# Patient Record
Sex: Male | Born: 1960 | Race: White | Hispanic: No | Marital: Married | Smoking: Never smoker
Health system: Southern US, Community
[De-identification: ages and names within clinical notes are randomized; demographics above are authoritative.]

## PROBLEM LIST (undated history)

## (undated) DIAGNOSIS — I499 Cardiac arrhythmia, unspecified: Secondary | ICD-10-CM

## (undated) DIAGNOSIS — I4891 Unspecified atrial fibrillation: Secondary | ICD-10-CM

## (undated) HISTORY — PX: COLONOSCOPY: SHX174

## (undated) HISTORY — PX: SHOULDER ARTHROSCOPY: SHX128

---

## 2005-04-15 ENCOUNTER — Ambulatory Visit: Payer: Self-pay | Admitting: Internal Medicine

## 2005-04-16 ENCOUNTER — Ambulatory Visit: Payer: Self-pay | Admitting: Internal Medicine

## 2007-01-13 ENCOUNTER — Ambulatory Visit: Payer: Self-pay | Admitting: Urology

## 2007-01-15 ENCOUNTER — Ambulatory Visit: Payer: Self-pay | Admitting: Urology

## 2007-02-11 IMAGING — US ABDOMEN ULTRASOUND
1 series · 17 of 25 positions shown · non-contrast
Comparison: none

REASON FOR EXAM: Epigastric pain
COMMENTS:

[Series 1: abdomen ultrasound · 17 of 55 slices shown]
[im 1/55]
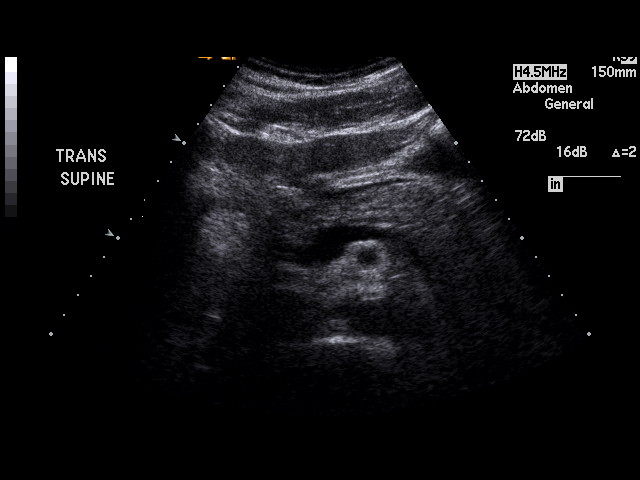
[im 5/55]
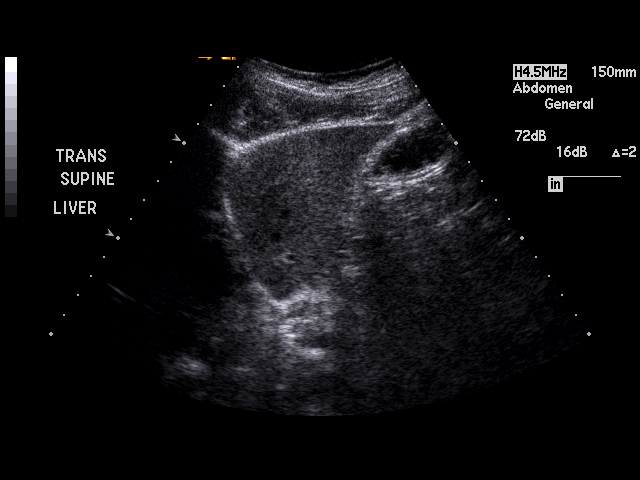
[im 7/55]
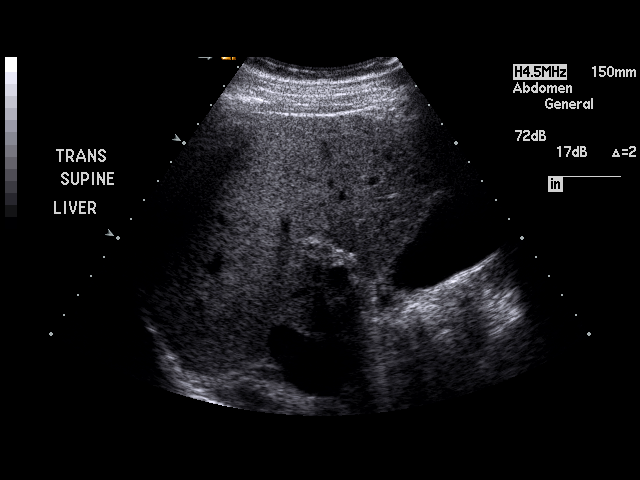
[im 12/55]
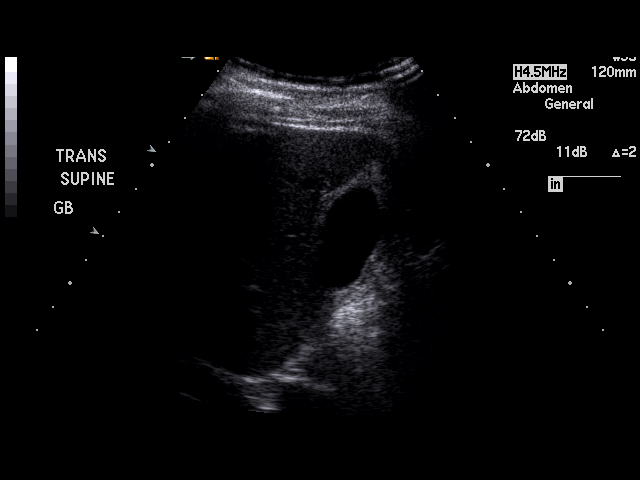
[im 14/55]
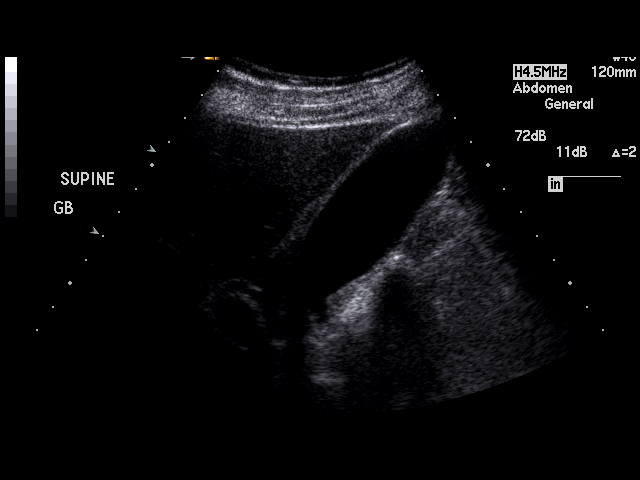
[im 19/55]
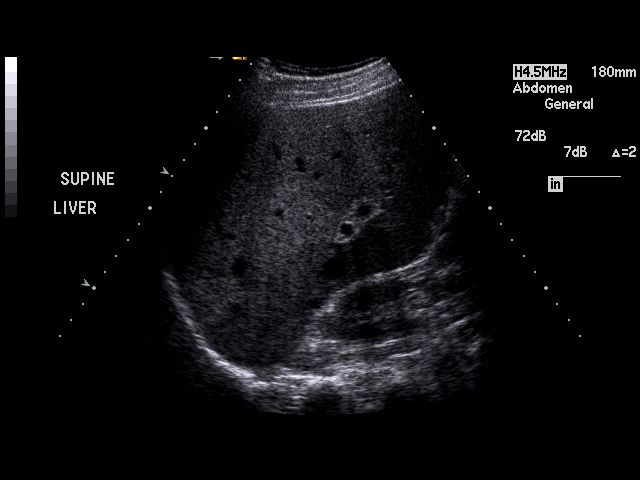
[im 21/55]
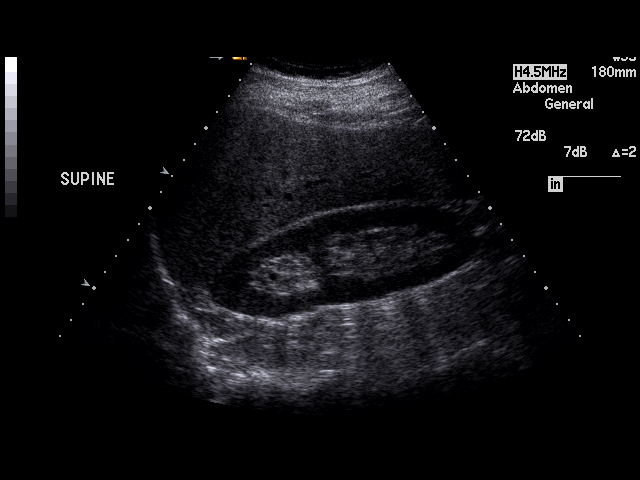
[im 25/55]
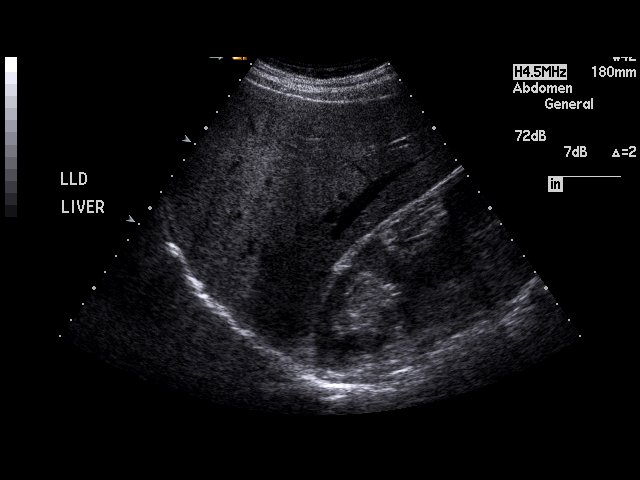
[im 28/55]
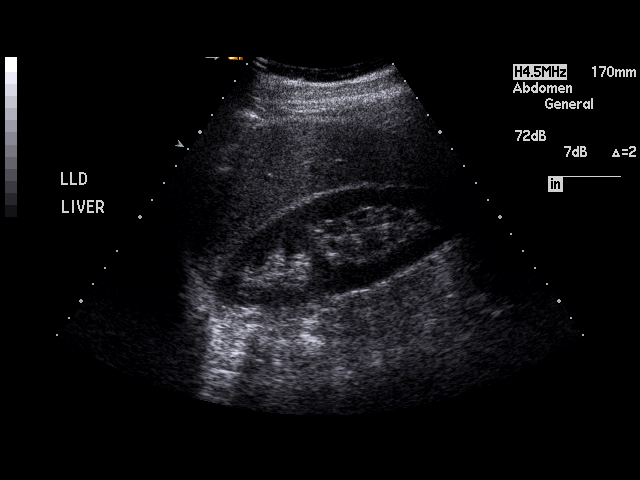
[im 30/55]
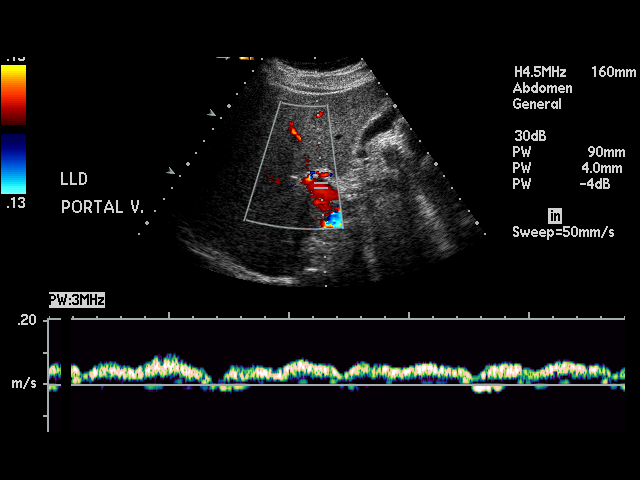
[im 34/55]
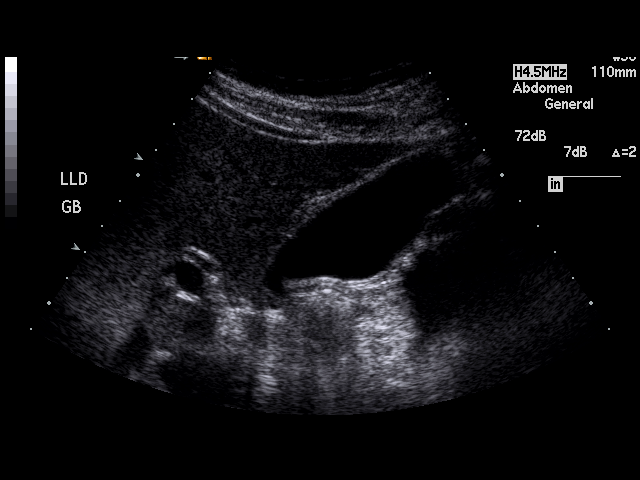
[im 37/55]
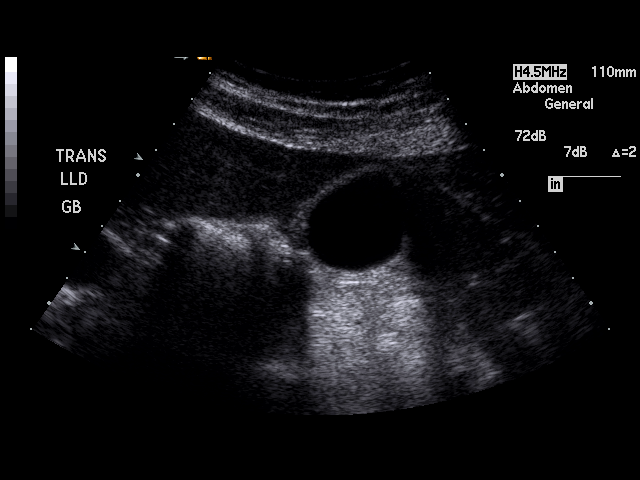
[im 41/55]
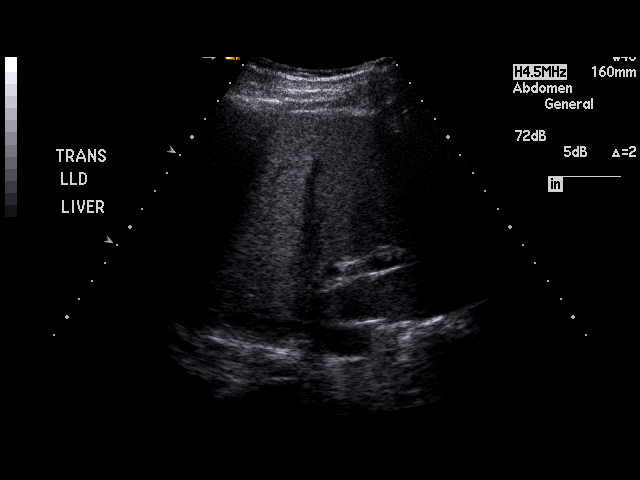
[im 43/55]
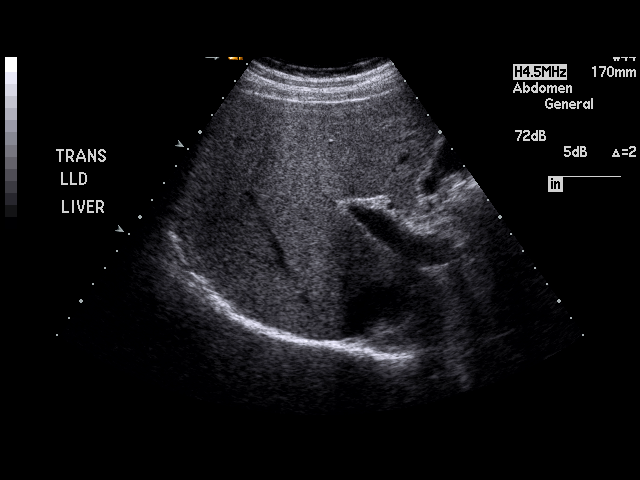
[im 48/55]
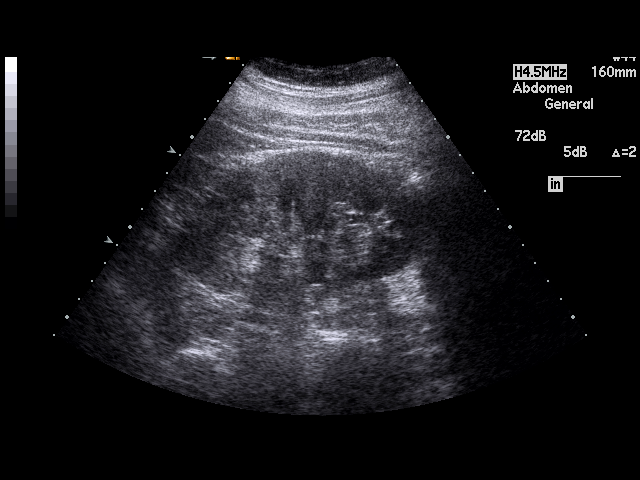
[im 50/55]
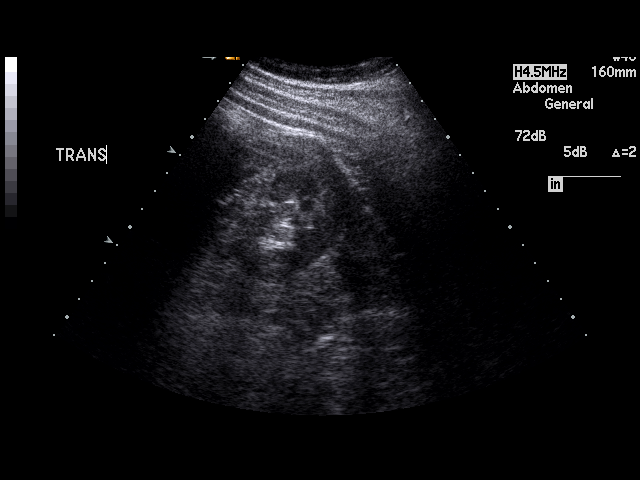
[im 55/55]
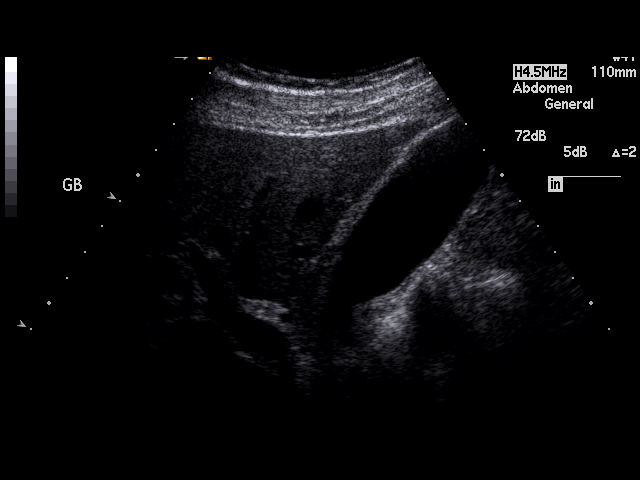

[17 of 25 positions shown; findings below may reference images not displayed]

PROCEDURE:     US  - US ABDOMEN GENERAL SURVEY  - April 16, 2005 [DATE]

RESULT:     The liver, spleen and pancreas are normal in appearance. No
gallstones are seen. There is no thickening of the gallbladder wall. The
common bile duct measures 4.8 mm in diameter which is within normal limits.
The kidneys show no hydronephrosis. There is no ascites.
IMPRESSION: No significant abnormalities are noted.

## 2010-01-08 ENCOUNTER — Ambulatory Visit: Payer: Self-pay | Admitting: Unknown Physician Specialty

## 2017-01-06 ENCOUNTER — Ambulatory Visit: Payer: Self-pay | Admitting: Medical

## 2017-01-06 ENCOUNTER — Encounter: Payer: Self-pay | Admitting: Medical

## 2017-01-06 VITALS — BP 118/78 | HR 78 | Temp 97.7°F | Resp 16 | Ht 75.0 in | Wt 225.0 lb

## 2017-01-06 DIAGNOSIS — D1722 Benign lipomatous neoplasm of skin and subcutaneous tissue of left arm: Secondary | ICD-10-CM

## 2017-01-06 DIAGNOSIS — M25512 Pain in left shoulder: Secondary | ICD-10-CM

## 2017-01-06 NOTE — Progress Notes (Addendum)
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 No acute or focal abnormality . No evidence of fracture or dislocation.   Subjective:    Patient ID: Keith James, male    DOB: 1961-02-13, 56 y.o.   MRN: [id number]  HPI  56 yo male started last Thursday with pain using left shoulder.   Pain on lifting arm(abduction), reaching at times (flexion) and arm reaching back (extension). He presents with a lipoma like growth  X  20 years, which has  changed in size over the years  .  Last seen by Dr. Kary Kos 8 yrs ago for referral to get a colonoscopy which was normal  And "they" told him next colonscopy was needed in 8 yrs.  He did start a new  exercise program but did not recall a specific movement that caused him pain.  He also has been removing river rock Doctor, hospital) from his yard, but again he recalls no injury.Taking ibuprofen with some relief , pain seems worse at night. Rates pain  6/10 with 8/10 at times.    Review of Systems  Constitutional: Negative for chills and fever.  HENT: Negative.   Eyes: Negative.   Respiratory: Negative.   Cardiovascular: Negative.   Gastrointestinal: Negative for diarrhea, nausea and vomiting.  Endocrine: Negative for cold intolerance and heat intolerance.  Genitourinary: Negative for dysuria.  Musculoskeletal: Negative for back pain, neck pain and neck stiffness.  Allergic/Immunologic: Negative for environmental allergies and food allergies.  Neurological: Negative for dizziness, syncope and light-headedness.  Hematological: Negative for adenopathy. Does not bruise/bleed easily.  Psychiatric/Behavioral: Negative for confusion and hallucinations.  No pain radiating down arm , no numbness or tingling.     Objective:   Physical Exam  Constitutional: He is oriented to person, place, and time. He appears well-developed and well-nourished.  HENT:  Head: Normocephalic and atraumatic.  Eyes: EOM are  normal. Pupils are equal, round, and reactive to light.  Musculoskeletal: He exhibits no edema or deformity.  Neurological: He is alert and oriented to person, place, and time.  Skin: Skin is warm and dry.  Psychiatric: He has a normal mood and affect. His behavior is normal.  Nursing note and vitals reviewed.   FROM of left shoulder however pain on abduction and external rotation and on extension.  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 Non- tender to palpation. Good range of motion of wrist and hand 2+ radial pulse.      Assessment & Plan:  Left shoulder pain, xray ordered , he will go on Wednesday morning 01/08/17. To take otc ibuprofen as needed for pain take as directed. Avoid exercising shoulder for now or lifting rocks. Will call patient with xray results once received. Xray reviewed with patient on 01/08/17, will refer to orthopedics, he requests to be seen by [facility].

## 2017-01-08 ENCOUNTER — Ambulatory Visit
Admission: RE | Admit: 2017-01-08 | Discharge: 2017-01-08 | Disposition: A | Discharge status: Home / Self Care | Payer: BLUE CROSS/BLUE SHIELD | Source: Ambulatory Visit | Attending: Medical | Admitting: Medical

## 2017-01-08 DIAGNOSIS — M25512 Pain in left shoulder: Secondary | ICD-10-CM | POA: Insufficient documentation

## 2017-01-08 NOTE — Addendum Note (Signed)
Addended by: Travious Vanover, Nira Conn R on: 01/08/2017 04:10 PM   Modules accepted: Orders

## 2017-01-09 ENCOUNTER — Telehealth: Payer: Self-pay | Admitting: Medical

## 2017-01-09 NOTE — Telephone Encounter (Signed)
Called patient 01/08/2017 to review left shoulder xray. Severe degenerative changes, will refer to orthopedics he prefers [employer]. Recommend he do no exercises or yard work that may aggravate shoulder.

## 2017-02-11 ENCOUNTER — Ambulatory Visit: Payer: Self-pay | Admitting: Medical

## 2017-02-11 ENCOUNTER — Encounter: Payer: Self-pay | Admitting: Medical

## 2017-02-11 VITALS — BP 110/70 | HR 64 | Temp 97.1°F | Resp 16 | Ht 73.0 in | Wt 220.0 lb

## 2017-02-11 DIAGNOSIS — H6983 Other specified disorders of Eustachian tube, bilateral: Secondary | ICD-10-CM

## 2017-02-11 NOTE — Progress Notes (Signed)
   Subjective:    Patient ID: Keith James, male    DOB: 03/30/1961, 56 y.o.   MRN: [id number]  HPI 56 yo male with complaints of difficulty hearing on the right ear "like I was talking in a tin can",no pain. Started on Saturday evening and cleared up this morning.He states his hearing is back to normal today. No other complaints.   Review of Systems  Constitutional: Negative for chills and fever.  HENT: Negative for congestion, ear pain and sore throat.   Eyes: Negative for discharge and itching.  Respiratory: Negative for shortness of breath.   Cardiovascular: Negative for chest pain.  Gastrointestinal: Negative for diarrhea, nausea and vomiting.  Endocrine: Negative for polydipsia.  Genitourinary: Negative for hematuria.  Musculoskeletal: Negative for myalgias.  Allergic/Immunologic: Negative for environmental allergies and food allergies.  Neurological: Negative for dizziness and syncope.  Hematological: Negative for adenopathy.  Psychiatric/Behavioral: Negative for confusion and hallucinations.       Objective:   Physical Exam  Constitutional: He is oriented to person, place, and time. He appears well-developed and well-nourished.  HENT:  Head: Normocephalic and atraumatic.  Right Ear: External ear normal. A middle ear effusion is present.  Left Ear: External ear normal. A middle ear effusion is present.  Nose: Nose normal.  Mouth/Throat: Oropharynx is clear and moist.  Eyes: Conjunctivae and EOM are normal. Pupils are equal, round, and reactive to light.  Neck: Normal range of motion. Neck supple.  Cardiovascular: Normal rate and regular rhythm.  Exam reveals no gallop and no friction rub.   No murmur heard. Pulmonary/Chest: Effort normal and breath sounds normal.  Lymphadenopathy:    He has no cervical adenopathy.  Neurological: He is alert and oriented to person, place, and time.  Skin: Skin is warm and dry.  Psychiatric: He has a normal mood and affect. His  behavior is normal.  Nursing note and vitals reviewed.  L>R fluid behind TM       Assessment & Plan:   Eustachian tube dysfunction, recommended OTC Zyrtec or Claritin. He says he does not like taking medication so he won't be using the OTC medications.  I also recommended that if it worsens to try OTC Zyrtec -D or Claritin-D to help dry up any fluid behind the TM. He wanted to know if we could just suck out the fluid, explained to patient it is located behind the ear drum. He says today it is not bothering him. He feels fine. Reviewed with patient if it becomes painful to return to the clinic. Return to the clinic as needed.

## 2018-11-05 IMAGING — CR DG SHOULDER 2+V*L*
1 series · 3 of 3 positions shown · non-contrast
Comparison: No recent prior .

CLINICAL DATA: Pain left shoulder.  No reported injury.

EXAM:
LEFT SHOULDER - 2+ VIEW

[Series 1: dg shoulder left · 0.14mm/px · 3 of 3 slices shown]
[im 1/3]
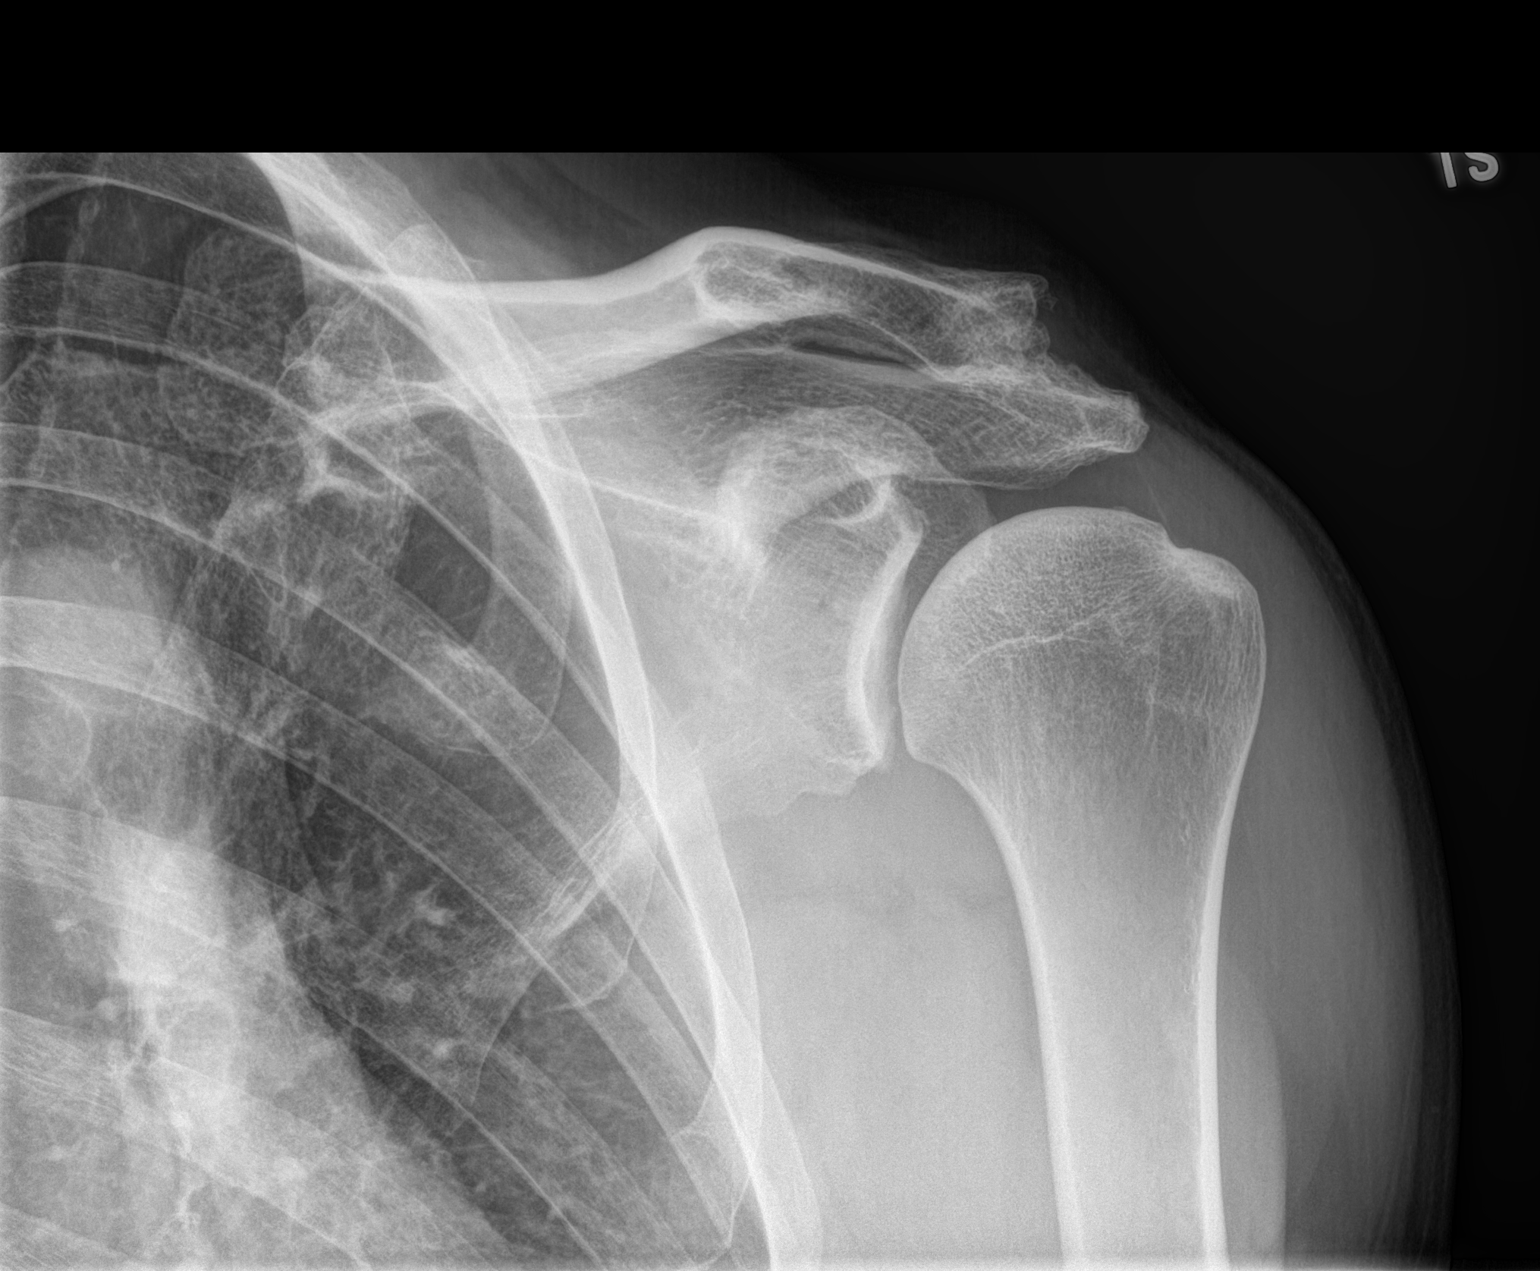
[im 2/3]
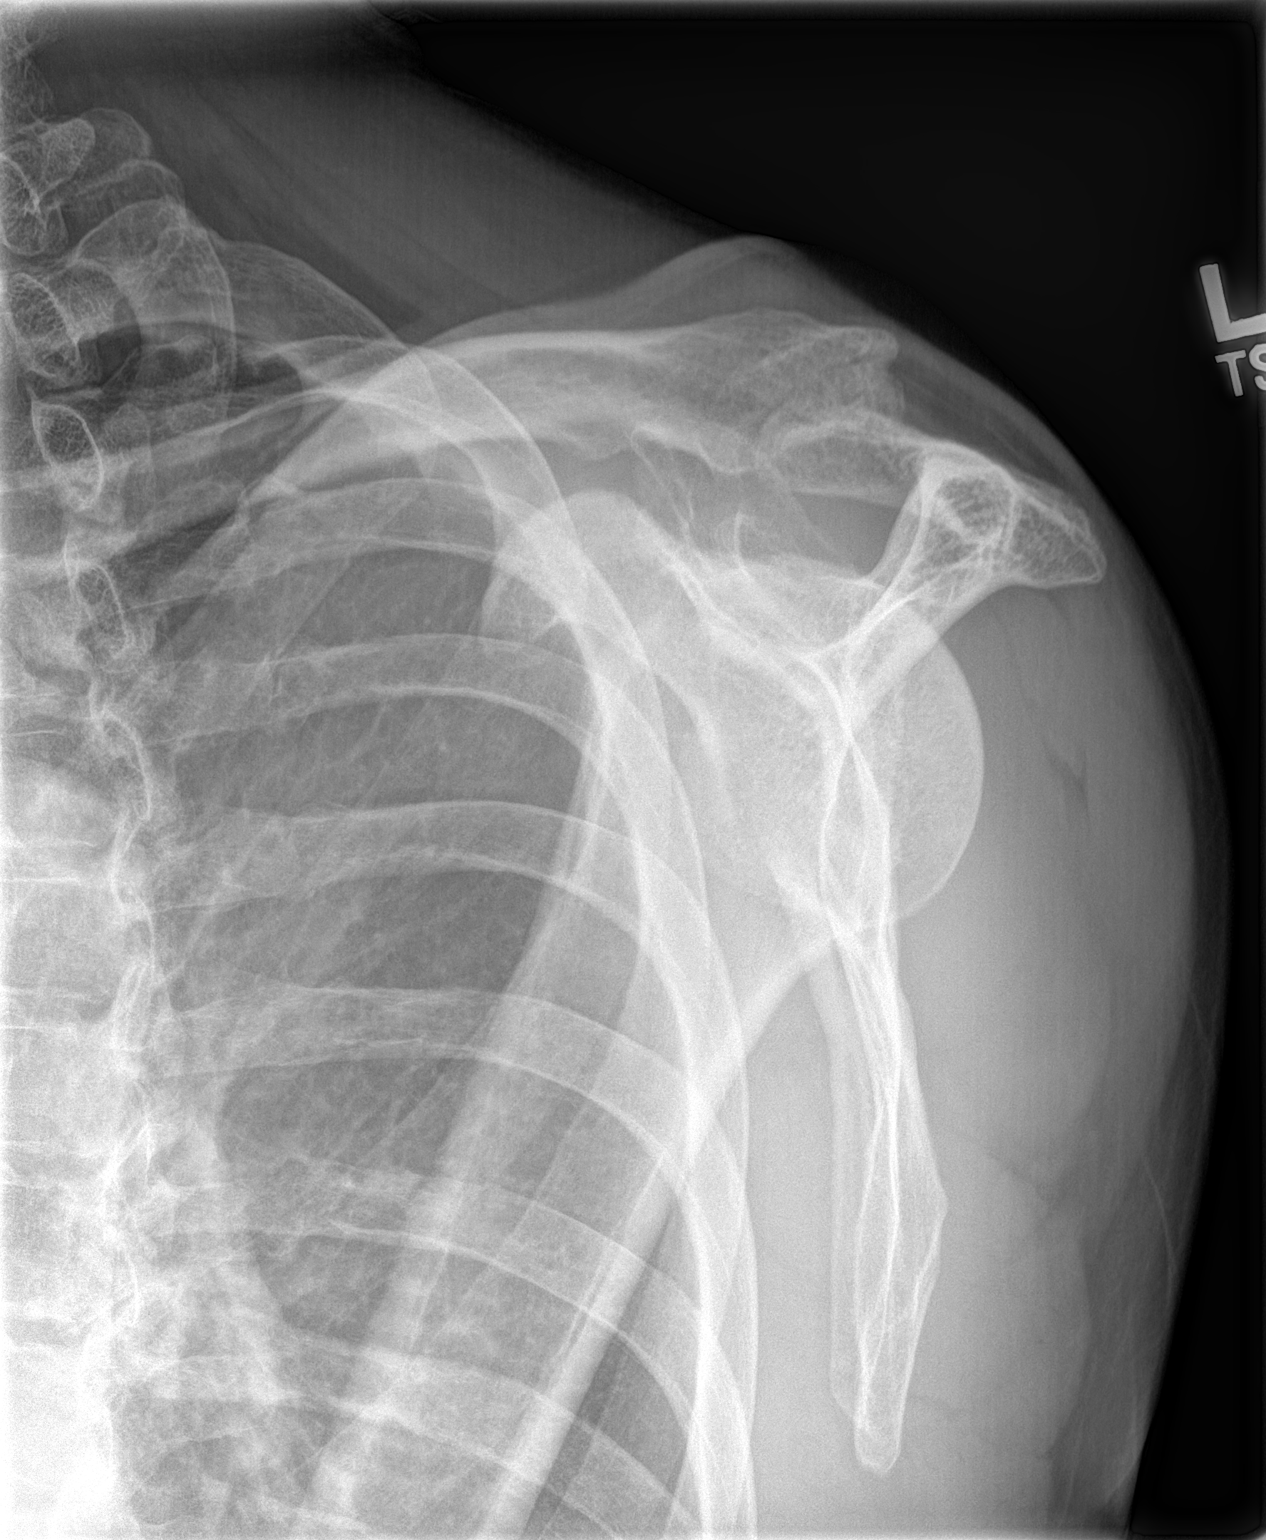
[im 3/3]
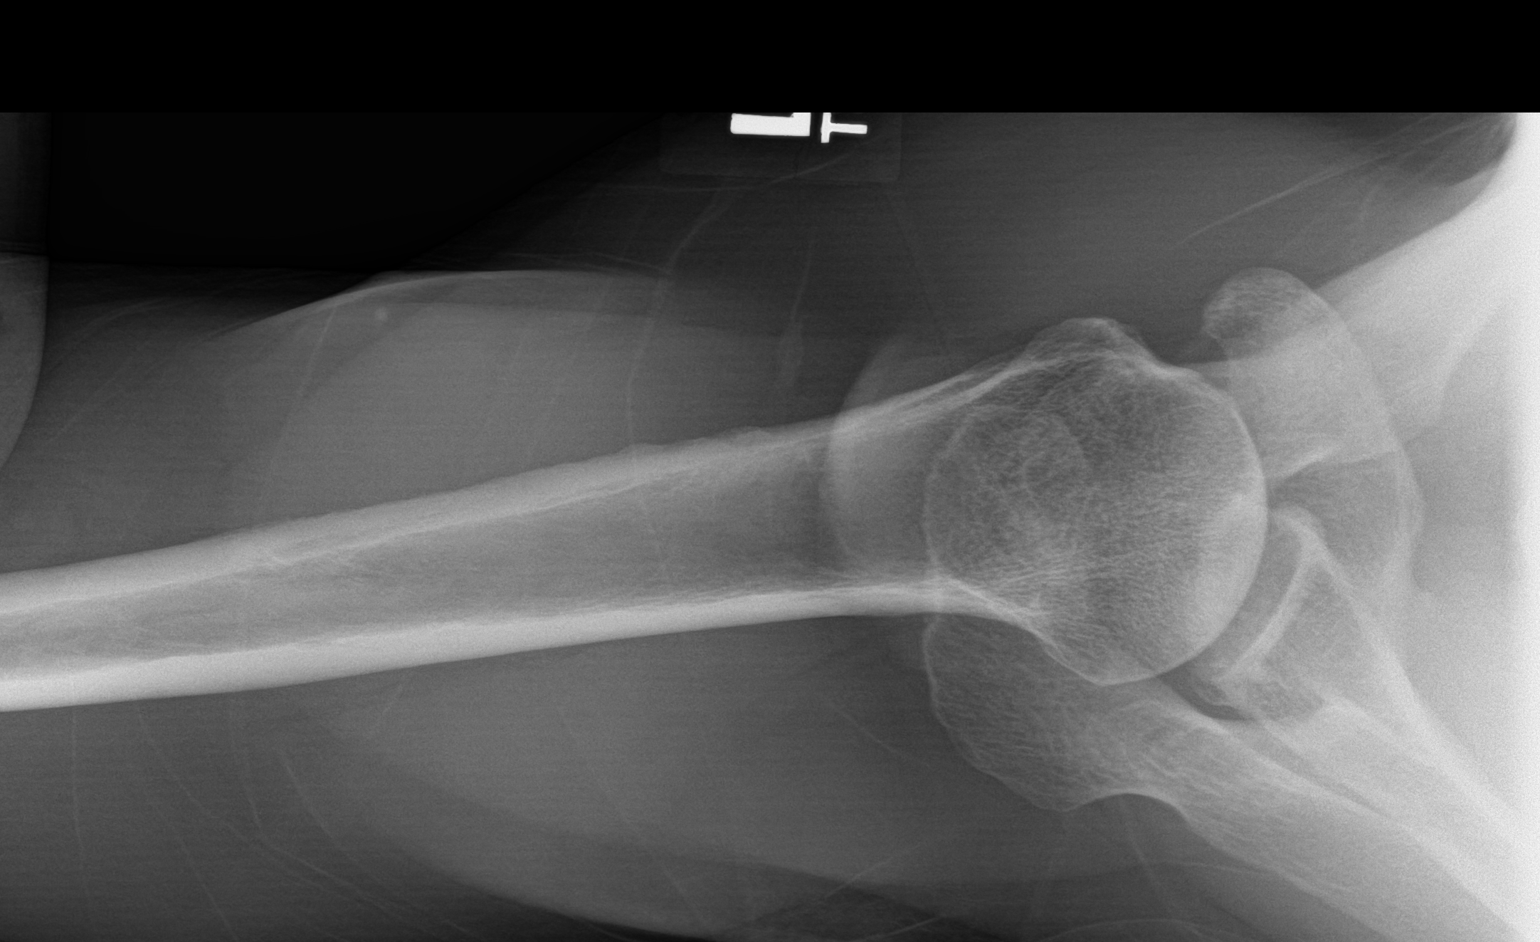

[3 of 3 positions shown; findings below may reference images not displayed]

FINDINGS: Severe acromioclavicular and glenohumeral degenerative change. No
acute or focal abnormality . No evidence of fracture or dislocation.
IMPRESSION: Severe acromioclavicular glenohumeral degenerative change. No acute
abnormality.

## 2019-07-09 ENCOUNTER — Other Ambulatory Visit
Admission: RE | Admit: 2019-07-09 | Discharge: 2019-07-09 | Disposition: A | Discharge status: Home / Self Care | Payer: BC Managed Care – PPO | Source: Ambulatory Visit | Attending: Cardiology | Admitting: Cardiology

## 2019-07-09 DIAGNOSIS — Z20828 Contact with and (suspected) exposure to other viral communicable diseases: Secondary | ICD-10-CM | POA: Diagnosis not present

## 2019-07-09 DIAGNOSIS — Z01812 Encounter for preprocedural laboratory examination: Secondary | ICD-10-CM | POA: Diagnosis present

## 2019-07-10 LAB — SARS CORONAVIRUS 2 (TAT 6-24 HRS): SARS Coronavirus 2: NEGATIVE

## 2019-07-13 ENCOUNTER — Ambulatory Visit: Payer: BC Managed Care – PPO | Admitting: Anesthesiology

## 2019-07-13 ENCOUNTER — Encounter
Admission: RE | Disposition: A | Discharge status: Home / Self Care | Payer: Self-pay | Source: Home / Self Care | Attending: Cardiology

## 2019-07-13 ENCOUNTER — Other Ambulatory Visit: Payer: Self-pay

## 2019-07-13 ENCOUNTER — Ambulatory Visit
Admission: RE | Admit: 2019-07-13 | Discharge: 2019-07-13 | Disposition: A | Discharge status: Home / Self Care | Payer: BC Managed Care – PPO | Attending: Cardiology | Admitting: Cardiology

## 2019-07-13 DIAGNOSIS — I4891 Unspecified atrial fibrillation: Secondary | ICD-10-CM | POA: Insufficient documentation

## 2019-07-13 HISTORY — PX: CARDIOVERSION: SHX1299

## 2019-07-13 SURGERY — CARDIOVERSION
Anesthesia: General

## 2019-07-13 MED ORDER — PHENYLEPHRINE HCL (PRESSORS) 10 MG/ML IV SOLN
INTRAVENOUS | Status: DC | PRN
  Administered 2019-07-13: 100 ug via INTRAVENOUS

## 2019-07-13 MED ORDER — SODIUM CHLORIDE 0.9 % IV SOLN
INTRAVENOUS | Status: DC
  Administered 2019-07-13: 07:00:00 via INTRAVENOUS

## 2019-07-13 MED ORDER — LIDOCAINE HCL (PF) 2 % IJ SOLN
INTRAMUSCULAR | Status: DC | PRN
  Administered 2019-07-13: 100 mg via INTRADERMAL

## 2019-07-13 MED ORDER — PROPOFOL 10 MG/ML IV BOLUS
INTRAVENOUS | Status: DC | PRN
  Administered 2019-07-13: 60 mg via INTRAVENOUS
  Administered 2019-07-13: 40 mg via INTRAVENOUS

## 2019-07-13 NOTE — Anesthesia Post-op Follow-up Note (Signed)
Anesthesia QCDR form completed.        

## 2019-07-13 NOTE — Anesthesia Preprocedure Evaluation (Addendum)
Anesthesia Evaluation  Patient identified by MRN, date of birth, ID band Patient awake    Reviewed: Allergy & Precautions, H&P , NPO status , Patient's Chart, lab work & pertinent test results  Airway Mallampati: II  TM Distance: >3 FB Neck ROM: full    Dental  (+) Teeth Intact   Pulmonary neg pulmonary ROS, neg COPD, neg recent URI,           Cardiovascular (-) angina(-) Past MI, (-) Cardiac Stents, (-) CABG and (-) CHF + dysrhythmias Atrial Fibrillation   NORMAL LEFT VENTRICULAR SYSTOLIC FUNCTION  WITH MILD LVH NORMAL RIGHT VENTRICULAR SYSTOLIC FUNCTION MILD VALVULAR REGURGITATION NO VALVULAR STENOSIS MILD MR, TR, PR EF 50-55%   Neuro/Psych neg Seizures negative neurological ROS  negative psych ROS   GI/Hepatic negative GI ROS, Neg liver ROS,   Endo/Other  negative endocrine ROS  Renal/GU negative Renal ROS  negative genitourinary   Musculoskeletal   Abdominal   Peds  Hematology negative hematology ROS (+)   Anesthesia Other Findings No past medical history on file.       Reproductive/Obstetrics negative OB ROS                            Anesthesia Physical Anesthesia Plan  ASA: II  Anesthesia Plan: General   Post-op Pain Management:    Induction:   PONV Risk Score and Plan:   Airway Management Planned: Natural Airway and Nasal Cannula  Additional Equipment:   Intra-op Plan:   Post-operative Plan:   Informed Consent: I have reviewed the patients History and Physical, chart, labs and discussed the procedure including the risks, benefits and alternatives for the proposed anesthesia with the patient or authorized representative who has indicated his/her understanding and acceptance.     Dental Advisory Given  Plan Discussed with: Anesthesiologist, CRNA and Surgeon  Anesthesia Plan Comments:         Anesthesia Quick Evaluation

## 2019-07-13 NOTE — Transfer of Care (Signed)
Immediate Anesthesia Transfer of Care Note  Patient: KINDLE CARVER  Procedure(s) Performed: CARDIOVERSION (N/A )  Patient Location: PACU and Cath Lab  Anesthesia Type:General  Level of Consciousness: awake, alert  and oriented  Airway & Oxygen Therapy: Patient Spontanous Breathing and Patient connected to nasal cannula oxygen  Post-op Assessment: Report given to RN and Post -op Vital signs reviewed and stable  Post vital signs: Reviewed and stable  Last Vitals:  Vitals Value Taken Time  BP 114/87 07/13/19 0746  Temp    Pulse 56 07/13/19 0747  Resp 13 07/13/19 0747  SpO2 98 % 07/13/19 0747    Last Pain:  Vitals:   07/13/19 0713  TempSrc: Oral  PainSc: 0-No pain         Complications: No apparent anesthesia complications

## 2019-07-13 NOTE — Discharge Instructions (Signed)
Electrical Cardioversion, Care After This sheet gives you information about how to care for yourself after your procedure. Your health care provider may also give you more specific instructions. If you have problems or questions, contact your health care provider. What can I expect after the procedure? After the procedure, it is common to have:  Some redness on the skin where the shocks were given. Follow these instructions at home:   Do not drive for 24 hours if you were given a medicine to help you relax (sedative).  Take over-the-counter and prescription medicines only as told by your health care provider.  Ask your health care provider how to check your pulse. Check it often.  Rest for 48 hours after the procedure or as told by your health care provider.  Avoid or limit your caffeine use as told by your health care provider. Contact a health care provider if:  You feel like your heart is beating too quickly or your pulse is not regular.  You have a serious muscle cramp that does not go away. Get help right away if:   You have discomfort in your chest.  You are dizzy or you feel faint.  You have trouble breathing or you are short of breath.  Your speech is slurred.  You have trouble moving an arm or leg on one side of your body.  Your fingers or toes turn cold or blue. This information is not intended to replace advice given to you by your health care provider. Make sure you discuss any questions you have with your health care provider. Document Released: 06/30/2013 Document Revised: 08/22/2017 Document Reviewed: 03/15/2016 Elsevier Patient Education  2020 Elsevier Inc.    Moderate Conscious Sedation, Adult, Care After These instructions provide you with information about caring for yourself after your procedure. Your health care provider may also give you more specific instructions. Your treatment has been planned according to current medical practices, but problems  sometimes occur. Call your health care provider if you have any problems or questions after your procedure. What can I expect after the procedure? After your procedure, it is common:  To feel sleepy for several hours.  To feel clumsy and have poor balance for several hours.  To have poor judgment for several hours.  To vomit if you eat too soon. Follow these instructions at home: For at least 24 hours after the procedure:   Do not: ? Participate in activities where you could fall or become injured. ? Drive. ? Use heavy machinery. ? Drink alcohol. ? Take sleeping pills or medicines that cause drowsiness. ? Make important decisions or sign legal documents. ? Take care of children on your own.  Rest. Eating and drinking  Follow the diet recommended by your health care provider.  If you vomit: ? Drink water, juice, or soup when you can drink without vomiting. ? Make sure you have little or no nausea before eating solid foods. General instructions  Have a responsible adult stay with you until you are awake and alert.  Take over-the-counter and prescription medicines only as told by your health care provider.  If you smoke, do not smoke without supervision.  Keep all follow-up visits as told by your health care provider. This is important. Contact a health care provider if:  You keep feeling nauseous or you keep vomiting.  You feel light-headed.  You develop a rash.  You have a fever. Get help right away if:  You have trouble breathing. This information is   not intended to replace advice given to you by your health care provider. Make sure you discuss any questions you have with your health care provider. Document Released: 06/30/2013 Document Revised: 08/22/2017 Document Reviewed: 12/30/2015 Elsevier Patient Education  2020 Elsevier Inc.  

## 2019-07-13 NOTE — Op Note (Signed)
[facility] Cardiology   07/13/2019                     7:44 AM  PATIENT:  Keith James    PRE-OPERATIVE DIAGNOSIS:  Cardioversion  Afib  POST-OPERATIVE DIAGNOSIS:  Same  PROCEDURE:  CARDIOVERSION  SURGEON:  Isaias Cowman, MD    ANESTHESIA:     PREOPERATIVE INDICATIONS:  [name] is a  58 y.o. male with a diagnosis of Cardioversion  Afib who failed conservative measures and elected for surgical management.    The risks benefits and alternatives were discussed with the patient preoperatively including but not limited to the risks of infection, bleeding, cardiopulmonary complications, the need for revision surgery, among others, and the patient was willing to proceed.   OPERATIVE PROCEDURE: Patient brought to special procedures holding area. Patient given 100 mg propofol. Cardioversion performed with 75, 120, 200 joules with successful conversion to NSR. There were no complications.

## 2019-07-13 NOTE — Anesthesia Postprocedure Evaluation (Signed)
Anesthesia Post Note  Patient: Keith James  Procedure(s) Performed: CARDIOVERSION (N/A )  Patient location during evaluation: PACU Anesthesia Type: General Level of consciousness: awake and alert Pain management: pain level controlled Vital Signs Assessment: post-procedure vital signs reviewed and stable Respiratory status: spontaneous breathing, nonlabored ventilation and respiratory function stable Cardiovascular status: blood pressure returned to baseline and stable Postop Assessment: no apparent nausea or vomiting Anesthetic complications: no     Last Vitals:  Vitals:   07/13/19 0815 07/13/19 0822  BP: 106/84 108/74  Pulse: (!) 53 (!) 58  Resp: 16 18  Temp:    SpO2: 97% 98%    Last Pain:  Vitals:   07/13/19 0822  TempSrc:   PainSc: 0-No pain                 Durenda Hurt

## 2019-07-21 ENCOUNTER — Ambulatory Visit: Payer: Self-pay

## 2019-07-21 ENCOUNTER — Other Ambulatory Visit: Payer: Self-pay

## 2019-07-21 DIAGNOSIS — Z23 Encounter for immunization: Secondary | ICD-10-CM

## 2019-12-21 ENCOUNTER — Other Ambulatory Visit: Payer: Self-pay

## 2019-12-21 DIAGNOSIS — Z0000 Encounter for general adult medical examination without abnormal findings: Secondary | ICD-10-CM

## 2019-12-22 LAB — LIPID PANEL
Chol/HDL Ratio: 4.7 ratio (ref 0.0–5.0)
Cholesterol, Total: 233 mg/dL — ABNORMAL HIGH (ref 100–199)
HDL: 50 mg/dL (ref >39)
LDL Chol Calc (NIH): 167 mg/dL — ABNORMAL HIGH (ref 0–99)
Triglycerides: 93 mg/dL (ref 0–149)
VLDL Cholesterol Cal: 16 mg/dL (ref 5–40)

## 2019-12-22 LAB — URINALYSIS, ROUTINE W REFLEX MICROSCOPIC
Bilirubin, UA: NEGATIVE
Glucose, UA: NEGATIVE
Ketones, UA: NEGATIVE
Leukocytes,UA: NEGATIVE
Nitrite, UA: NEGATIVE
Protein,UA: NEGATIVE
RBC, UA: NEGATIVE
Specific Gravity, UA: 1.026 (ref 1.005–1.030)
Urobilinogen, Ur: 0.2 mg/dL (ref 0.2–1.0)
pH, UA: 5.5 (ref 5.0–7.5)

## 2019-12-22 LAB — COMPREHENSIVE METABOLIC PANEL
ALT: 20 IU/L (ref 0–44)
AST: 21 IU/L (ref 0–40)
Albumin/Globulin Ratio: 2.1 (ref 1.2–2.2)
Albumin: 4.5 g/dL (ref 3.8–4.9)
Alkaline Phosphatase: 65 IU/L (ref 39–117)
BUN/Creatinine Ratio: 18 (ref 10–24)
BUN: 22 mg/dL (ref 8–27)
Bilirubin Total: 0.4 mg/dL (ref 0.0–1.2)
CO2: 21 mmol/L (ref 20–29)
Calcium: 9 mg/dL (ref 8.6–10.2)
Chloride: 107 mmol/L — ABNORMAL HIGH (ref 96–106)
Creatinine, Ser: 1.23 mg/dL (ref 0.76–1.27)
GFR calc Af Amer: 73 mL/min/{1.73_m2} (ref >59)
GFR calc non Af Amer: 63 mL/min/{1.73_m2} (ref >59)
Globulin, Total: 2.1 g/dL (ref 1.5–4.5)
Glucose: 98 mg/dL (ref 65–99)
Potassium: 4.6 mmol/L (ref 3.5–5.2)
Sodium: 139 mmol/L (ref 134–144)
Total Protein: 6.6 g/dL (ref 6.0–8.5)

## 2019-12-22 LAB — CBC WITH DIFFERENTIAL/PLATELET
Basophils Absolute: 0.1 10*3/uL (ref 0.0–0.2)
Basos: 1 %
EOS (ABSOLUTE): 0.3 10*3/uL (ref 0.0–0.4)
Eos: 6 %
Hematocrit: 45 % (ref 37.5–51.0)
Hemoglobin: 15.3 g/dL (ref 13.0–17.7)
Immature Grans (Abs): 0 10*3/uL (ref 0.0–0.1)
Immature Granulocytes: 0 %
Lymphocytes Absolute: 1.5 10*3/uL (ref 0.7–3.1)
Lymphs: 30 %
MCH: 34.1 pg — ABNORMAL HIGH (ref 26.6–33.0)
MCHC: 34 g/dL (ref 31.5–35.7)
MCV: 100 fL — ABNORMAL HIGH (ref 79–97)
Monocytes Absolute: 0.4 10*3/uL (ref 0.1–0.9)
Monocytes: 7 %
Neutrophils Absolute: 2.9 10*3/uL (ref 1.4–7.0)
Neutrophils: 56 %
Platelets: 223 10*3/uL (ref 150–450)
RBC: 4.49 x10E6/uL (ref 4.14–5.80)
RDW: 11.8 % (ref 11.6–15.4)
WBC: 5.1 10*3/uL (ref 3.4–10.8)

## 2019-12-22 LAB — PSA: Prostate Specific Ag, Serum: 0.9 ng/mL (ref 0.0–4.0)

## 2020-07-27 ENCOUNTER — Other Ambulatory Visit
Admission: RE | Admit: 2020-07-27 | Discharge: 2020-07-27 | Disposition: A | Discharge status: Home / Self Care | Payer: 59 | Source: Ambulatory Visit | Attending: Internal Medicine | Admitting: Internal Medicine

## 2020-07-27 ENCOUNTER — Other Ambulatory Visit: Payer: Self-pay

## 2020-07-27 DIAGNOSIS — Z20822 Contact with and (suspected) exposure to covid-19: Secondary | ICD-10-CM | POA: Diagnosis not present

## 2020-07-27 DIAGNOSIS — Z01818 Encounter for other preprocedural examination: Secondary | ICD-10-CM | POA: Diagnosis present

## 2020-07-27 LAB — SARS CORONAVIRUS 2 (TAT 6-24 HRS): SARS Coronavirus 2: NEGATIVE

## 2020-07-28 ENCOUNTER — Encounter: Payer: Self-pay | Admitting: Internal Medicine

## 2020-07-31 ENCOUNTER — Other Ambulatory Visit: Payer: Self-pay

## 2020-07-31 ENCOUNTER — Ambulatory Visit
Admission: RE | Admit: 2020-07-31 | Discharge: 2020-07-31 | Disposition: A | Discharge status: Home / Self Care | Payer: 59 | Attending: Internal Medicine | Admitting: Internal Medicine

## 2020-07-31 ENCOUNTER — Ambulatory Visit: Payer: 59 | Admitting: Anesthesiology

## 2020-07-31 ENCOUNTER — Encounter
Admission: RE | Disposition: A | Discharge status: Home / Self Care | Payer: Self-pay | Source: Home / Self Care | Attending: Internal Medicine

## 2020-07-31 ENCOUNTER — Encounter: Payer: Self-pay | Admitting: Internal Medicine

## 2020-07-31 DIAGNOSIS — Z1211 Encounter for screening for malignant neoplasm of colon: Secondary | ICD-10-CM | POA: Diagnosis present

## 2020-07-31 DIAGNOSIS — Z5329 Procedure and treatment not carried out because of patient's decision for other reasons: Secondary | ICD-10-CM | POA: Insufficient documentation

## 2020-07-31 DIAGNOSIS — Z79899 Other long term (current) drug therapy: Secondary | ICD-10-CM | POA: Insufficient documentation

## 2020-07-31 DIAGNOSIS — I4891 Unspecified atrial fibrillation: Secondary | ICD-10-CM | POA: Insufficient documentation

## 2020-07-31 HISTORY — DX: Cardiac arrhythmia, unspecified: I49.9

## 2020-07-31 HISTORY — DX: Unspecified atrial fibrillation: I48.91

## 2020-07-31 SURGERY — COLONOSCOPY
Anesthesia: General

## 2020-07-31 MED ORDER — SODIUM CHLORIDE 0.9 % IV SOLN: INTRAVENOUS | Status: DC

## 2020-07-31 MED ORDER — PROPOFOL 500 MG/50ML IV EMUL
INTRAVENOUS | Status: AC
  Filled 2020-07-31: qty 50

## 2020-07-31 NOTE — Anesthesia Preprocedure Evaluation (Signed)
Anesthesia Evaluation  Patient identified by MRN, date of birth, ID band Patient awake    Reviewed: Allergy & Precautions, H&P , NPO status , Patient's Chart, lab work & pertinent test results  History of Anesthesia Complications Negative for: history of anesthetic complications  Airway Mallampati: II  TM Distance: >3 FB Neck ROM: full    Dental  (+) Teeth Intact, Dental Advidsory Given   Pulmonary neg pulmonary ROS, neg COPD, neg recent URI,           Cardiovascular Exercise Tolerance: Good (-) hypertension(-) angina(-) Past MI, (-) Cardiac Stents, (-) CABG and (-) CHF + dysrhythmias Atrial Fibrillation   NORMAL LEFT VENTRICULAR SYSTOLIC FUNCTION  WITH MILD LVH NORMAL RIGHT VENTRICULAR SYSTOLIC FUNCTION MILD VALVULAR REGURGITATION NO VALVULAR STENOSIS MILD MR, TR, PR EF 50-55%   Neuro/Psych neg Seizures negative neurological ROS  negative psych ROS   GI/Hepatic negative GI ROS, Neg liver ROS,   Endo/Other  negative endocrine ROS  Renal/GU negative Renal ROS  negative genitourinary   Musculoskeletal   Abdominal   Peds  Hematology negative hematology ROS (+)   Anesthesia Other Findings No past medical history on file.       Reproductive/Obstetrics negative OB ROS                             Anesthesia Physical  Anesthesia Plan  ASA: II  Anesthesia Plan: General   Post-op Pain Management:    Induction: Intravenous  PONV Risk Score and Plan: 2 and Propofol infusion and TIVA  Airway Management Planned: Natural Airway and Nasal Cannula  Additional Equipment:   Intra-op Plan:   Post-operative Plan:   Informed Consent: I have reviewed the patients History and Physical, chart, labs and discussed the procedure including the risks, benefits and alternatives for the proposed anesthesia with the patient or authorized representative who has indicated his/her understanding and  acceptance.     Dental Advisory Given  Plan Discussed with: Anesthesiologist, CRNA and Surgeon  Anesthesia Plan Comments:         Anesthesia Quick Evaluation

## 2020-07-31 NOTE — OR Nursing (Signed)
After speaking with anesthesiologist, patient expressed concerns with anesthesia and desire to not proceed with procedure. After Dr. Alice Reichert spoke to patient, patient expressed desire for IV to be removed and procedure to be canceled. IV d/c'd. Patient left via ambulation.

## 2020-07-31 NOTE — H&P (Signed)
Outpatient short stay form Pre-procedure 07/31/2020 8:27 AM Saron Vanorman K. Alice Reichert, M.D.  Primary Physician: [place] Pink, M.D.  Reason for visit: Colon cancer screening  History of present illness: Patient presents for colonoscopy for colon cancer screening. The patient denies complaints of abdominal pain, significant change in bowel habits, or rectal bleeding.  Patient has significant concerns on potential risks of recurrent atrial fibrillation with propofol sedation as described to him by Dr. Rosey Bath, the anesthesiologist of the day.  After discussing the risk, the patient opts to cancel his colonoscopy.He is taking an antiarrhythmic and beta blocker and has no native coronary disease.     Current Facility-Administered Medications:  .  0.9 %  sodium chloride infusion, , Intravenous, Continuous, Milfay, Benay Pike, MD, Last Rate: 20 mL/hr at 07/31/20 0932, Continued from Pre-op at 07/31/20 0803  Medications Prior to Admission  Medication Sig Dispense Refill Last Dose  . cholecalciferol (VITAMIN D3) 25 MCG (1000 UT) tablet Take 1,000 Units by mouth daily.   Past Week at Unknown time  . flecainide (TAMBOCOR) 50 MG tablet Take 50 mg by mouth 2 (two) times daily.   07/30/2020 at Unknown time  . magnesium oxide (MAG-OX) 400 MG tablet Take 400 mg by mouth daily.   Past Week at Unknown time  . metoprolol succinate (TOPROL-XL) 25 MG 24 hr tablet Take 25 mg by mouth daily.   07/30/2020 at Unknown time  . Misc Natural Products (GLUCOSAMINE CHOND COMPLEX/MSM PO) Take 1 tablet by mouth daily.   Past Week at Unknown time  . Multiple Vitamins-Minerals (MULTIVITAMIN WITH MINERALS) tablet Take 1 tablet by mouth daily.   07/30/2020 at Unknown time  . Omega-3 1000 MG CAPS Take 1,000 mg by mouth daily.   Past Week at Unknown time  . vitamin B-12 (CYANOCOBALAMIN) 1000 MCG tablet Take 1,000 mcg by mouth daily.   Past Week at Unknown time  . vitamin C (ASCORBIC ACID) 500 MG tablet Take 500 mg by mouth daily.   Past  Week at Unknown time  . apixaban (ELIQUIS) 5 MG TABS tablet Take 5 mg by mouth 2 (two) times daily. (Patient not taking: Reported on 07/31/2020)   Not Taking at Unknown time     No Known Allergies   Past Medical History:  Diagnosis Date  . Atrial fibrillation status post cardioversion ([place])   . Dysrhythmia     Review of systems:  Otherwise negative.    Physical Exam  Gen: Alert, oriented. Appears stated age.  HEENT: [place]/AT. PERRLA. Lungs: CTA, no wheezes. CV: RR nl S1, S2. Abd: soft, benign, no masses. BS+ Ext: No edema. Pulses 2+    Planned procedures: Colonoscopy will be canceled today as per patient wishes.  I honored remember if I did I scoped him as I did an EGD. I honored the patient's wishes but also told him that colon cancer screening is very important and should NOT be delayed.  I discussed alternative screening modalities such as CT colonography, stool Cologuard testing, annual Hemoccult/FIT testing, unsedated colonoscopy, unsedated flexible sigmoidoscopy which is suboptimal but also available.  Patient opts to consider other options and to call our office once he has decided to schedule. Patient's IV was removed and patient discharged from the preoperative area.   Chadwick Reiswig K. Alice Reichert, M.D. Gastroenterology 07/31/2020  8:27 AM

## 2020-07-31 NOTE — Interval H&P Note (Signed)
History and Physical Interval Note:  07/31/2020 8:37 AM  Keith James  has presented today for surgery, with the diagnosis of COLON CANCER SCREENING.  The various methods of treatment have been discussed with the patient and family. After consideration of risks, benefits and other options for treatment, the patient has REFUSED the Procedure(s): COLONOSCOPY (N/A) as a surgical intervention.  The patient's history has been reviewed, patient examined, no change in status, stable for surgery.  I have reviewed the patient's chart and labs.  Questions were answered to the patient's satisfaction.  Patient opts to CANCEL colonoscopy today and call my office to schedule an alternative screening modality (Cologuard, CT colonography, etc.).   [place], [place]

## 2021-12-20 DIAGNOSIS — Z125 Encounter for screening for malignant neoplasm of prostate: Secondary | ICD-10-CM | POA: Diagnosis not present

## 2021-12-20 DIAGNOSIS — Z0000 Encounter for general adult medical examination without abnormal findings: Secondary | ICD-10-CM | POA: Diagnosis not present

## 2021-12-27 DIAGNOSIS — I480 Paroxysmal atrial fibrillation: Secondary | ICD-10-CM | POA: Diagnosis not present

## 2021-12-27 DIAGNOSIS — Z0000 Encounter for general adult medical examination without abnormal findings: Secondary | ICD-10-CM | POA: Diagnosis not present

## 2021-12-27 DIAGNOSIS — E7800 Pure hypercholesterolemia, unspecified: Secondary | ICD-10-CM | POA: Diagnosis not present

## 2021-12-27 DIAGNOSIS — I4891 Unspecified atrial fibrillation: Secondary | ICD-10-CM | POA: Diagnosis not present

## 2021-12-27 DIAGNOSIS — R42 Dizziness and giddiness: Secondary | ICD-10-CM | POA: Diagnosis not present

## 2021-12-27 DIAGNOSIS — E785 Hyperlipidemia, unspecified: Secondary | ICD-10-CM | POA: Diagnosis not present

## 2023-01-28 ENCOUNTER — Telehealth: Payer: Self-pay | Admitting: Urology

## 2023-01-28 NOTE — Telephone Encounter (Signed)
Patient was referred to our office by Dr. Burnett Sheng. He was a patient of Dr. Evelene Croon more than 10 years ago. He wasn't aware that Dr. Amada Jupiter office closed, and he does not have any of his records. Can you see him without those records?

## 2023-02-05 ENCOUNTER — Ambulatory Visit: Payer: 59 | Admitting: Urology

## 2023-02-05 VITALS — BP 145/87 | HR 64 | Ht 74.5 in | Wt 211.0 lb

## 2023-02-05 DIAGNOSIS — N529 Male erectile dysfunction, unspecified: Secondary | ICD-10-CM

## 2023-02-05 DIAGNOSIS — N486 Induration penis plastica: Secondary | ICD-10-CM

## 2023-02-05 DIAGNOSIS — Z125 Encounter for screening for malignant neoplasm of prostate: Secondary | ICD-10-CM

## 2023-02-05 NOTE — Progress Notes (Signed)
   02/05/23 12:21 PM   Kai Levins [birth year] [id number]  CC: Penile curvature, ED, PSA screening  HPI: Healthy 62 year old male referred for the above issues.  He reports onset of penile curvature to the left in January 2024 as well as some simultaneous problems with erections.  He reports his curvature is about 40 degrees leftward, this does not prevent sexual intercourse, no pain with erections, curvature is stable.  He was trialed on 10 mg Cialis with PCP and he only took 1 dose that did not seem to make a significant difference in the erections.  He seems to be overall minimally bothered by the curvature.  He does not like to take medications unless needed.  PSA was also checked and was normal at 1.52.   PMH: Past Medical History:  Diagnosis Date   Atrial fibrillation status post cardioversion [facility])    Dysrhythmia     Surgical History: Past Surgical History:  Procedure Laterality Date   CARDIOVERSION N/A 07/13/2019   Procedure: CARDIOVERSION;  Surgeon: Marcina Millard, MD;  Location: [facility] ORS;  Service: Cardiovascular;  Laterality: N/A;   COLONOSCOPY     SHOULDER ARTHROSCOPY Left     Social History:  reports that he has never smoked. He has never used smokeless tobacco. He reports that he does not currently use alcohol. He reports that he does not use drugs.  Physical Exam: BP (!) 145/87 (BP Location: Left Arm, Patient Position: Sitting, Cuff Size: Large)   Pulse 64   Ht 6' 2.5" (1.892 m)   Wt 211 lb (95.7 kg)   BMI 26.73 kg/m    Constitutional:  Alert and oriented, No acute distress. Cardiovascular: No clubbing, cyanosis, or edema. Respiratory: Normal respiratory effort, no increased work of breathing. 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  We reviewed options including observation alone, Xiaflex, or more definitive treatments with surgical intervention like penile plication, excision and grafting, or penile prosthesis.  He is minimally bothered at this time and this does not cause pain or discomfort with intercourse and he would like to pursue observation alone at this time.  In terms of the erections, I recommended trying the low-dose Cialis every other day for at least a few weeks to see if this improves the blood flow and achieve some better erections.  In terms of PSA screening, we reviewed that normal PSA for his age is less than 4, and I recommended continuing screening every other year through about age 49 per the guideline recommendations.  He prefers to follow-up as needed, okay to schedule Xiaflex or follow-up appointment if he has further questions or desires more aggressive treatments for peyronies disease in the future  Legrand Rams, MD 02/05/2023  [facility] [address] [place], [place] [address] [phone]

## 2023-02-05 NOTE — Patient Instructions (Signed)
You have peyronies disease.  This is a benign, noncancerous scar tissue of the penis that causes curvature, and can cause problems with erections.  Observation is a perfectly fine option if you do not have pain and it does not limit your sexual activity for you or your partner.  Other option would be Xiaflex injections, which on average take approximately 5 to 6 months to complete, and improve the curvature by about 30%.  Visit [email or web address] for further details about those injection treatments
